# Patient Record
Sex: Male | Born: 1995 | Race: Black or African American | Hispanic: No | Marital: Single | Smoking: Never smoker
Health system: Southern US, Community
[De-identification: ages and names within clinical notes are randomized; demographics above are authoritative.]

---
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
## 2015-11-25 ENCOUNTER — Ambulatory Visit (HOSPITAL_COMMUNITY)
Admission: EM | Admit: 2015-11-25 | Discharge: 2015-11-25 | Disposition: A | Discharge status: Nursing Home (Includes ICF) | Payer: Managed Care, Other (non HMO)

## 2015-11-25 ENCOUNTER — Emergency Department (HOSPITAL_COMMUNITY)
Admission: EM | Admit: 2015-11-25 | Discharge: 2015-11-25 | Disposition: A | Discharge status: Home / Self Care | Payer: Managed Care, Other (non HMO) | Attending: Emergency Medicine | Admitting: Emergency Medicine

## 2015-11-25 ENCOUNTER — Encounter (HOSPITAL_COMMUNITY): Payer: Self-pay | Admitting: Emergency Medicine

## 2015-11-25 ENCOUNTER — Emergency Department (HOSPITAL_COMMUNITY): Payer: Managed Care, Other (non HMO)

## 2015-11-25 DIAGNOSIS — Y9367 Activity, basketball: Secondary | ICD-10-CM | POA: Diagnosis not present

## 2015-11-25 DIAGNOSIS — Y999 Unspecified external cause status: Secondary | ICD-10-CM | POA: Insufficient documentation

## 2015-11-25 DIAGNOSIS — Y929 Unspecified place or not applicable: Secondary | ICD-10-CM | POA: Diagnosis not present

## 2015-11-25 DIAGNOSIS — W500XXA Accidental hit or strike by another person, initial encounter: Secondary | ICD-10-CM | POA: Diagnosis not present

## 2015-11-25 DIAGNOSIS — S060X0A Concussion without loss of consciousness, initial encounter: Secondary | ICD-10-CM | POA: Insufficient documentation

## 2015-11-25 DIAGNOSIS — R42 Dizziness and giddiness: Secondary | ICD-10-CM

## 2015-11-25 DIAGNOSIS — R5383 Other fatigue: Secondary | ICD-10-CM | POA: Diagnosis not present

## 2015-11-25 DIAGNOSIS — S0990XA Unspecified injury of head, initial encounter: Secondary | ICD-10-CM

## 2015-11-25 DIAGNOSIS — Z79899 Other long term (current) drug therapy: Secondary | ICD-10-CM | POA: Diagnosis not present

## 2015-11-25 MED ORDER — IBUPROFEN 800 MG PO TABS
800.0000 mg | ORAL_TABLET | Freq: Once | ORAL | Status: AC
  Administered 2015-11-25: 800 mg via ORAL
  Filled 2015-11-25: qty 1

## 2015-11-25 MED ORDER — ACETAMINOPHEN 500 MG PO TABS
1000.0000 mg | ORAL_TABLET | Freq: Once | ORAL | Status: AC
  Administered 2015-11-25: 1000 mg via ORAL
  Filled 2015-11-25: qty 2
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
## 2015-11-25 NOTE — ED Provider Notes (Signed)
CSN: [id number]     Arrival date & time 11/25/15  1403 History   None    Chief Complaint  Patient presents with  . Facial Pain  . Dizziness   (Consider location/radiation/quality/duration/timing/severity/associated sxs/prior Treatment) HPI Comments: Patient c/o head injury due to being struck in mouth/face during basketball game about an hour ago. He denies LOC.  He states he has been getting progressively dizzy and feeling weak and sleepy.  Patient is a 20 y.o. male presenting with dizziness and head injury. The history is provided by the patient.  Dizziness Quality:  Head spinning and lightheadedness Severity:  Moderate Onset quality:  Sudden Duration:  1 hour Timing:  Constant Progression:  Worsening Chronicity:  New Context: not with loss of consciousness   Relieved by:  Nothing Associated symptoms: headaches   Head Injury Location:  Generalized Time since incident:  1 hour Mechanism of injury: direct blow   Pain details:    Quality:  Dull   Radiates to:  Face   Severity:  Severe   Duration:  1 hour   Timing:  Constant   Progression:  Worsening Chronicity:  New Relieved by:  Nothing Worsened by:  Nothing tried Associated symptoms: disorientation and headache     History reviewed. No pertinent past medical history. History reviewed. No pertinent past surgical history. History reviewed. No pertinent family history. Social History  Substance Use Topics  . Smoking status: Never Smoker   . Smokeless tobacco: Never Used  . Alcohol Use: No    Review of Systems  Constitutional: Negative.   Eyes: Negative.   Respiratory: Negative.   Cardiovascular: Negative.   Genitourinary: Negative.   Musculoskeletal: Negative.   Skin: Negative.   Allergic/Immunologic: Negative.   Neurological: Positive for dizziness and headaches.  Hematological: Negative.   Psychiatric/Behavioral: Negative.     Allergies  Review of patient's allergies indicates no known  allergies.  Home Medications   Prior to Admission medications   Medication Sig Start Date End Date Taking? Authorizing Provider  naproxen (NAPROSYN) 500 MG tablet Take 1 tablet (500 mg total) by mouth 2 (two) times daily with a meal. 10/10/12   Andrena MewsMichael D Rigby, DO   Meds Ordered and Administered this Visit  Medications - No data to display  BP 127/63 mmHg  Pulse 66  Temp(Src) 97.1 F (36.2 C) (Oral)  Resp 16  SpO2 100% No data found.   Physical Exam  Constitutional: He is oriented to person, place, and time. He appears well-developed.  HENT:  Head: Normocephalic.  Right Ear: External ear normal.  Left Ear: External ear normal.  Mouth/Throat: Oropharynx is clear and moist.  Upper lip with erythematous abrasion.  Eyes: Conjunctivae are normal. Pupils are equal, round, and reactive to light.  Neck: Normal range of motion. Neck supple.  Cardiovascular: Normal rate, regular rhythm and normal heart sounds.   Pulmonary/Chest: Effort normal and breath sounds normal.  Neurological: He is alert and oriented to person, place, and time.  Patient is lethargic and sleepy.  He arouses to verbal. His speech is clear.      ED Course  Procedures (including critical care time)  Labs Review Labs Reviewed - No data to display  Imaging Review No results found.   Visual Acuity Review  Right Eye Distance:   Left Eye Distance:   Bilateral Distance:    Right Eye Near:   Left Eye Near:    Bilateral Near:         MDM  Head injury  Lethargy Dizziness Concussion  Transfer to ED to r/o severe concussion or intracranial injury. Transfer to ED to r/o severe concussion  Deatra CanterWilliam J Oxford, FNP 11/25/15 1516

## 2015-11-25 NOTE — Discharge Instructions (Signed)
Concussion, Adult  A concussion, or closed-head injury, is a brain injury caused by a direct blow to the head or by a quick and sudden movement (jolt) of the head or neck. Concussions are usually not life-threatening. Even so, the effects of a concussion can be serious. If you have had a concussion before, you are more likely to experience concussion-like symptoms after a direct blow to the head.   CAUSES  · Direct blow to the head, such as from running into another player during a soccer game, being hit in a fight, or hitting your head on a hard surface.  · A jolt of the head or neck that causes the brain to move back and forth inside the skull, such as in a car crash.  SIGNS AND SYMPTOMS  The signs of a concussion can be hard to notice. Early on, they may be missed by you, family members, and health care providers. You may look fine but act or feel differently.  Symptoms are usually temporary, but they may last for days, weeks, or even longer. Some symptoms may appear right away while others may not show up for hours or days. Every head injury is different. Symptoms include:  · Mild to moderate headaches that will not go away.  · A feeling of pressure inside your head.  · Having more trouble than usual:    Learning or remembering things you have heard.    Answering questions.    Paying attention or concentrating.    Organizing daily tasks.    Making decisions and solving problems.  · Slowness in thinking, acting or reacting, speaking, or reading.  · Getting lost or being easily confused.  · Feeling tired all the time or lacking energy (fatigued).  · Feeling drowsy.  · Sleep disturbances.    Sleeping more than usual.    Sleeping less than usual.    Trouble falling asleep.    Trouble sleeping (insomnia).  · Loss of balance or feeling lightheaded or dizzy.  · Nausea or vomiting.  · Numbness or tingling.  · Increased sensitivity to:    Sounds.    Lights.    Distractions.  · Vision problems or eyes that tire  easily.  · Diminished sense of taste or smell.  · Ringing in the ears.  · Mood changes such as feeling sad or anxious.  · Becoming easily irritated or angry for little or no reason.  · Lack of motivation.  · Seeing or hearing things other people do not see or hear (hallucinations).  DIAGNOSIS  Your health care provider can usually diagnose a concussion based on a description of your injury and symptoms. He or she will ask whether you passed out (lost consciousness) and whether you are having trouble remembering events that happened right before and during your injury.  Your evaluation might include:  · A brain scan to look for signs of injury to the brain. Even if the test shows no injury, you may still have a concussion.  · Blood tests to be sure other problems are not present.  TREATMENT  · Concussions are usually treated in an emergency department, in urgent care, or at a clinic. You may need to stay in the hospital overnight for further treatment.  · Tell your health care provider if you are taking any medicines, including prescription medicines, over-the-counter medicines, and natural remedies. Some medicines, such as blood thinners (anticoagulants) and aspirin, may increase the chance of complications. Also tell your health care   provider whether you have had alcohol or are taking illegal drugs. This information may affect treatment.  · Your health care provider will send you home with important instructions to follow.  · How fast you will recover from a concussion depends on many factors. These factors include how severe your concussion is, what part of your brain was injured, your age, and how healthy you were before the concussion.  · Most people with mild injuries recover fully. Recovery can take time. In general, recovery is slower in older persons. Also, persons who have had a concussion in the past or have other medical problems may find that it takes longer to recover from their current injury.  HOME  CARE INSTRUCTIONS  General Instructions  · Carefully follow the directions your health care provider gave you.  · Only take over-the-counter or prescription medicines for pain, discomfort, or fever as directed by your health care provider.  · Take only those medicines that your health care provider has approved.  · Do not drink alcohol until your health care provider says you are well enough to do so. Alcohol and certain other drugs may slow your recovery and can put you at risk of further injury.  · If it is harder than usual to remember things, write them down.  · If you are easily distracted, try to do one thing at a time. For example, do not try to watch TV while fixing dinner.  · Talk with family members or close friends when making important decisions.  · Keep all follow-up appointments. Repeated evaluation of your symptoms is recommended for your recovery.  · Watch your symptoms and tell others to do the same. Complications sometimes occur after a concussion. Older adults with a brain injury may have a higher risk of serious complications, such as a blood clot on the brain.  · Tell your teachers, school nurse, school counselor, coach, athletic trainer, or work manager about your injury, symptoms, and restrictions. Tell them about what you can or cannot do. They should watch for:    Increased problems with attention or concentration.    Increased difficulty remembering or learning new information.    Increased time needed to complete tasks or assignments.    Increased irritability or decreased ability to cope with stress.    Increased symptoms.  · Rest. Rest helps the brain to heal. Make sure you:    Get plenty of sleep at night. Avoid staying up late at night.    Keep the same bedtime hours on weekends and weekdays.    Rest during the day. Take daytime naps or rest breaks when you feel tired.  · Limit activities that require a lot of thought or concentration. These include:    Doing homework or job-related  work.    Watching TV.    Working on the computer.  · Avoid any situation where there is potential for another head injury (football, hockey, soccer, basketball, martial arts, downhill snow sports and horseback riding). Your condition will get worse every time you experience a concussion. You should avoid these activities until you are evaluated by the appropriate follow-up health care providers.  Returning To Your Regular Activities  You will need to return to your normal activities slowly, not all at once. You must give your body and brain enough time for recovery.  · Do not return to sports or other athletic activities until your health care provider tells you it is safe to do so.  · Ask   your health care provider when you can drive, ride a bicycle, or operate heavy machinery. Your ability to react may be slower after a brain injury. Never do these activities if you are dizzy.  · Ask your health care provider about when you can return to work or school.  Preventing Another Concussion  It is very important to avoid another brain injury, especially before you have recovered. In rare cases, another injury can lead to permanent brain damage, brain swelling, or death. The risk of this is greatest during the first 7-10 days after a head injury. Avoid injuries by:  · Wearing a seat belt when riding in a car.  · Drinking alcohol only in moderation.  · Wearing a helmet when biking, skiing, skateboarding, skating, or doing similar activities.  · Avoiding activities that could lead to a second concussion, such as contact or recreational sports, until your health care provider says it is okay.  · Taking safety measures in your home.    Remove clutter and tripping hazards from floors and stairways.    Use grab bars in bathrooms and handrails by stairs.    Place non-slip mats on floors and in bathtubs.    Improve lighting in dim areas.  SEEK MEDICAL CARE IF:  · You have increased problems paying attention or  concentrating.  · You have increased difficulty remembering or learning new information.  · You need more time to complete tasks or assignments than before.  · You have increased irritability or decreased ability to cope with stress.  · You have more symptoms than before.  Seek medical care if you have any of the following symptoms for more than 2 weeks after your injury:  · Lasting (chronic) headaches.  · Dizziness or balance problems.  · Nausea.  · Vision problems.  · Increased sensitivity to noise or light.  · Depression or mood swings.  · Anxiety or irritability.  · Memory problems.  · Difficulty concentrating or paying attention.  · Sleep problems.  · Feeling tired all the time.  SEEK IMMEDIATE MEDICAL CARE IF:  · You have severe or worsening headaches. These may be a sign of a blood clot in the brain.  · You have weakness (even if only in one hand, leg, or part of the face).  · You have numbness.  · You have decreased coordination.  · You vomit repeatedly.  · You have increased sleepiness.  · One pupil is larger than the other.  · You have convulsions.  · You have slurred speech.  · You have increased confusion. This may be a sign of a blood clot in the brain.  · You have increased restlessness, agitation, or irritability.  · You are unable to recognize people or places.  · You have neck pain.  · It is difficult to wake you up.  · You have unusual behavior changes.  · You lose consciousness.  MAKE SURE YOU:  · Understand these instructions.  · Will watch your condition.  · Will get help right away if you are not doing well or get worse.     This information is not intended to replace advice given to you by your health care provider. Make sure you discuss any questions you have with your health care provider.     Document Released: 08/06/2003 Document Revised: 06/06/2014 Document Reviewed: 12/06/2012  Elsevier Interactive Patient Education ©2016 [employer]

## 2015-11-25 NOTE — ED Notes (Signed)
[facility] a [facility] of face pain and dizziness secondary to getting hit in the face with a shoulder while playing basketball. The patient denied any LOC.

## 2015-11-25 NOTE — ED Notes (Signed)
Pt states he was playing basketball earlier today and was hit in the face with a shoulder and busted his upper lip. Pt states he is having dizziness and "feels weak" pt states he feels "hot". Pt denies any LOC. Pt sent from [facility] for further evaluation. pupils are equal round and reactive. Pt is alert and ox4. Denies any n/v.

## 2016-12-23 ENCOUNTER — Encounter (HOSPITAL_COMMUNITY): Payer: Self-pay | Admitting: *Deleted

## 2016-12-23 ENCOUNTER — Ambulatory Visit (HOSPITAL_COMMUNITY)
Admission: EM | Admit: 2016-12-23 | Discharge: 2016-12-23 | Disposition: A | Discharge status: Home / Self Care | Payer: BLUE CROSS/BLUE SHIELD | Attending: Internal Medicine | Admitting: Internal Medicine

## 2016-12-23 DIAGNOSIS — Z79899 Other long term (current) drug therapy: Secondary | ICD-10-CM | POA: Insufficient documentation

## 2016-12-23 DIAGNOSIS — Z113 Encounter for screening for infections with a predominantly sexual mode of transmission: Secondary | ICD-10-CM | POA: Insufficient documentation

## 2016-12-23 DIAGNOSIS — R369 Urethral discharge, unspecified: Secondary | ICD-10-CM | POA: Insufficient documentation

## 2016-12-23 MED ORDER — CEFTRIAXONE SODIUM 250 MG IJ SOLR
250.0000 mg | Freq: Once | INTRAMUSCULAR | Status: AC
  Administered 2016-12-23: 250 mg via INTRAMUSCULAR

## 2016-12-23 MED ORDER — AZITHROMYCIN 250 MG PO TABS
1000.0000 mg | ORAL_TABLET | Freq: Once | ORAL | Status: AC
  Administered 2016-12-23: 1000 mg via ORAL

## 2016-12-23 MED ORDER — AZITHROMYCIN 250 MG PO TABS
ORAL_TABLET | ORAL | Status: AC
  Filled 2016-12-23: qty 4

## 2016-12-23 MED ORDER — CEFTRIAXONE SODIUM 250 MG IJ SOLR
INTRAMUSCULAR | Status: AC
  Filled 2016-12-23: qty 250

## 2016-12-23 NOTE — ED Provider Notes (Signed)
CSN: [id number]     Arrival date & time 12/23/16  1515 History   None    Chief Complaint  Patient presents with  . Penile Discharge   (Consider location/radiation/quality/duration/timing/severity/associated sxs/prior Treatment) 21 year old male comes in with four-day history of pain at discharge. He denies any other symptoms such as fever, chills, night sweats. Denies abdominal pain, nausea, vomiting. Denies penile pain, redness, itchiness, swelling. Denies urinary symptoms  such as frequency, dysuria, hematuria. he is sexually active with one partner, did have condom use, but he states "condom broke". He does not know if partner has any symptoms. Would like to be treated empirically for STDs.      History reviewed. No pertinent past medical history. History reviewed. No pertinent surgical history. No family history on file. Social History  Substance Use Topics  . Smoking status: Never Smoker  . Smokeless tobacco: Never Used  . Alcohol use No    Review of Systems  Reason unable to perform ROS: See HPI as above.    Allergies  Patient has no known allergies.  Home Medications   Prior to Admission medications   Medication Sig Start Date End Date Taking? Authorizing Provider  naproxen (NAPROSYN) 500 MG tablet Take 1 tablet (500 mg total) by mouth 2 (two) times daily with a meal. 10/10/12   Andrena Mewsigby, Michael D, DO   Meds Ordered and Administered this Visit   Medications  azithromycin [facility](ZITHROMAX) tablet 1,000 mg (1,000 mg Oral Given 12/23/16 1608)  cefTRIAXone (ROCEPHIN) injection 250 mg (250 mg Intramuscular Given 12/23/16 1608)    BP 122/74 (BP Location: Right Arm)   Pulse 78   Temp 98.6 F (37 C) (Oral)   Resp 18   SpO2 100%  No data found.   Physical Exam  Constitutional: He is oriented to person, place, and time. He appears well-developed and well-nourished. No distress.  HENT:  Head: Normocephalic and atraumatic.  Cardiovascular: Normal rate, regular rhythm and  normal heart sounds.  Exam reveals no gallop and no friction rub.   No murmur heard. Pulmonary/Chest: Effort normal and breath sounds normal. He has no wheezes. He has no rales.  Abdominal: Soft. Bowel sounds are normal. He exhibits no mass. There is no tenderness. There is no rebound and no guarding.  Genitourinary:  Genitourinary Comments: Deferred due to patient's preference.  Neurological: He is alert and oriented to person, place, and time.  Skin: Skin is warm and dry.  Psychiatric: He has a normal mood and affect. His behavior is normal. Judgment normal.    Urgent Care Course     Procedures (including critical care time)  Labs Review Labs Reviewed  URINE CYTOLOGY ANCILLARY ONLY    Imaging Review No results found.     MDM   1. Penile discharge    Patient would like to be treated empirically for gonorrhea and Chlamydia. Azithromycin and Rocephin in office today. Patient to refrain from sexual activity for the next 7 days. Urine cytology sent, patient will be contacted with any positive results that requires additional treatment. Monitor for worsening symptoms such as fever, abdominal pain, nausea, vomiting, penile pain/swelling, to follow-up for reevaluation.    Belinda FisherYu, Sharilynn Cassity V, PA-C 12/23/16 1622

## 2016-12-23 NOTE — ED Triage Notes (Signed)
Pt  Reports   Penile   Discharge   4  Days         Denies   Any    Other   Symptoms

## 2016-12-23 NOTE — Discharge Instructions (Signed)
You were treated empirically for gonorrhea and Chlamydia. Urine testing sent, you'll be contacted with any positive results that require further treatment. Refrain from sexual activity for the next 7 days. Monitor for worsening of symptoms such as fever, abdominal pain, nausea, vomiting, penile pain/swelling, to follow-up for reevaluation.

## 2016-12-26 LAB — URINE CYTOLOGY ANCILLARY ONLY
CHLAMYDIA, DNA PROBE: POSITIVE — AB
NEISSERIA GONORRHEA: NEGATIVE
Trichomonas: NEGATIVE

## 2017-03-01 IMAGING — CT CT HEAD W/O CM
4 series · 18 of 47 positions shown, 20 images · non-contrast
Comparison: None

CLINICAL DATA: Struck in face by another person's head while
playing basketball, severe frontal headache and dizziness since

EXAM:
CT HEAD WITHOUT CONTRAST
TECHNIQUE: Contiguous axial images were obtained from the base of the skull
through the vertex without intravenous contrast.

[Series 201: head w/o, idose (1) · axial · non-contrast · 0.49mm/px · z∈[+96,+216]mm · 8 of 32 slices shown, 10 images]
[im 4/32  brain]
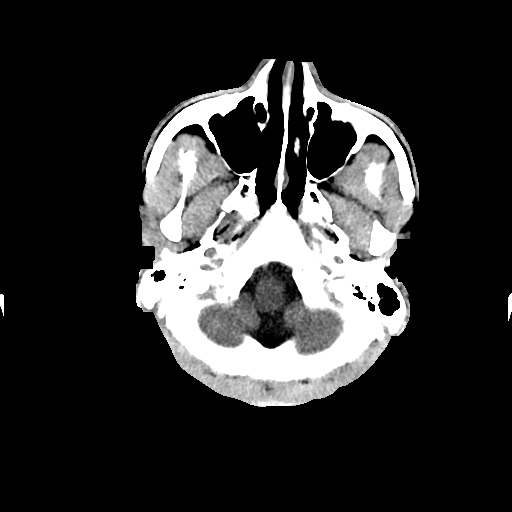
[im 4/32  bone]
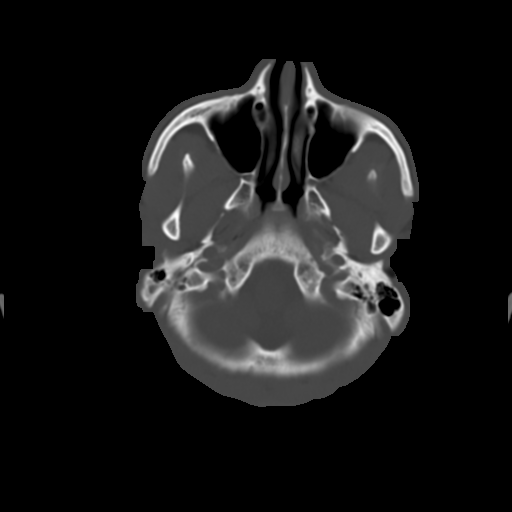
[im 7/32  brain]
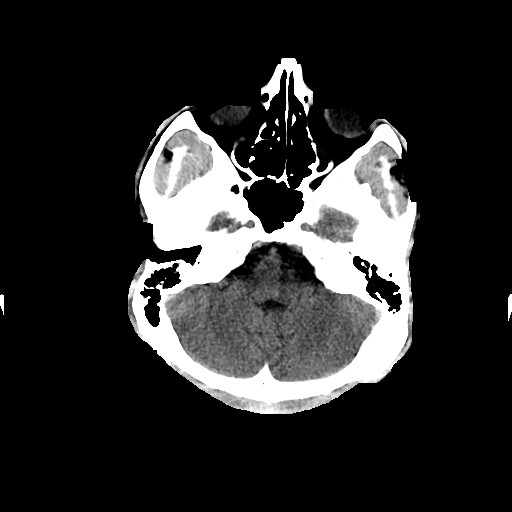
[im 11/32  brain]
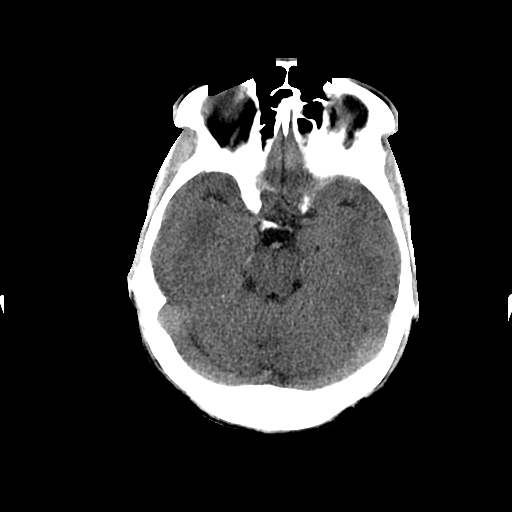
[im 14/32  brain]
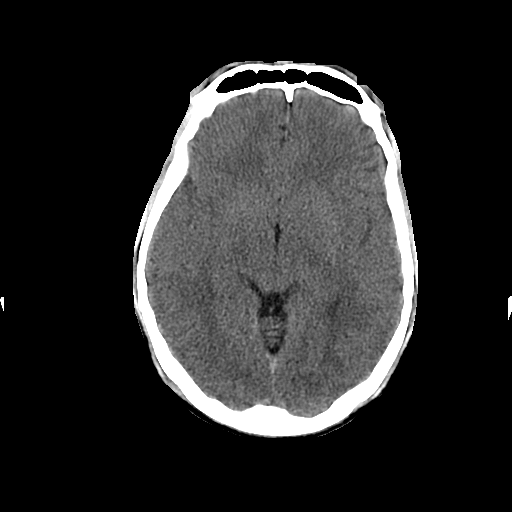
[im 18/32  brain]
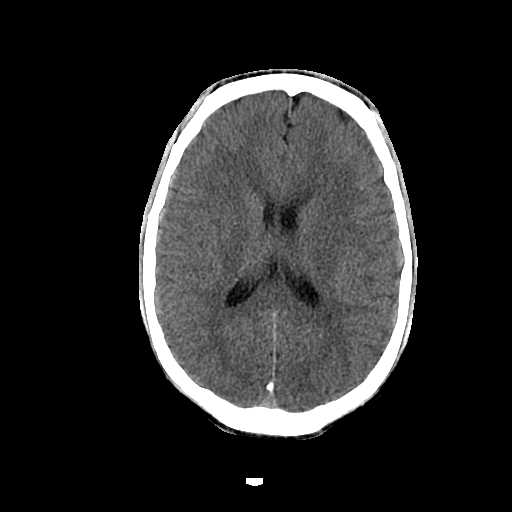
[im 18/32  bone]
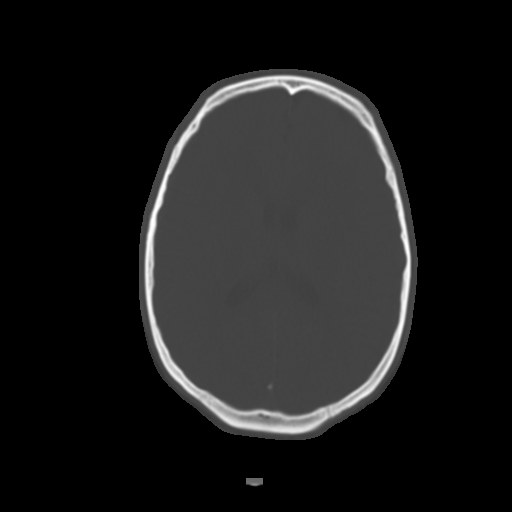
[im 21/32  brain]
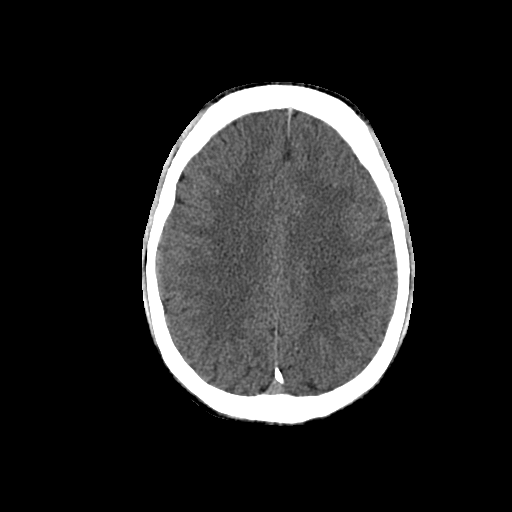
[im 25/32  brain]
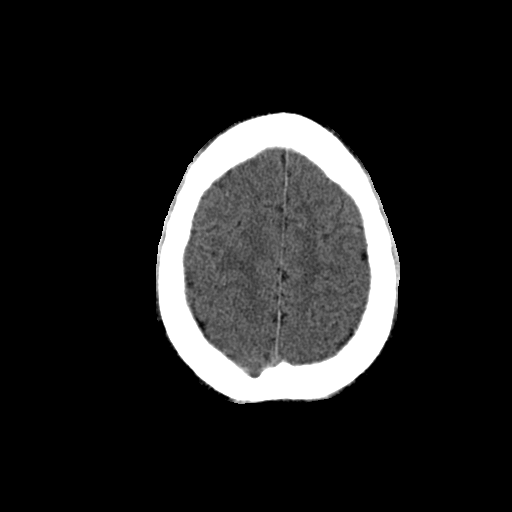
[im 28/32  brain]
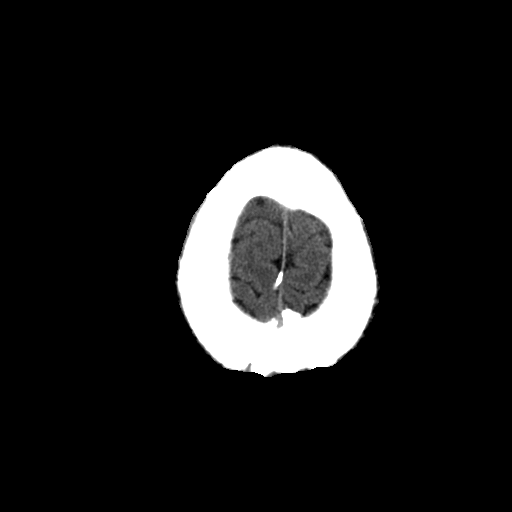

[Series 202: head w/o bone, idose (1) · axial · non-contrast · 0.49mm/px · z∈[+95,+145]mm · 4 of 64 slices shown]
[im 7/64  bone]
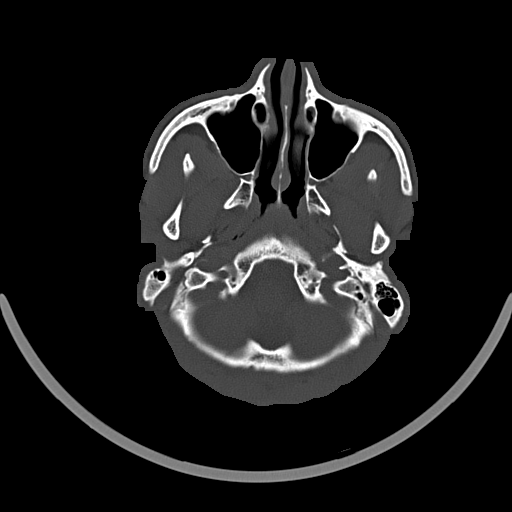
[im 14/64  bone]
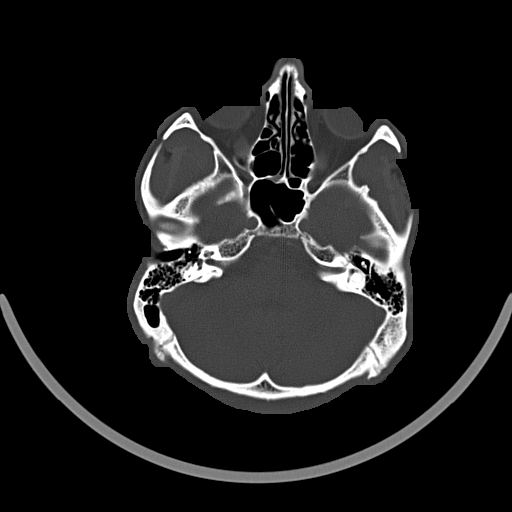
[im 20/64  bone]
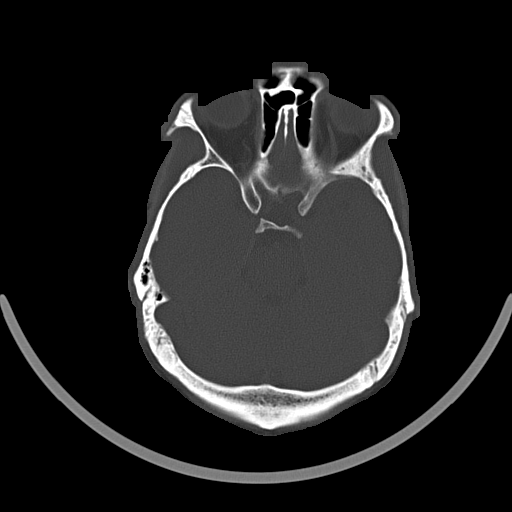
[im 27/64  bone]
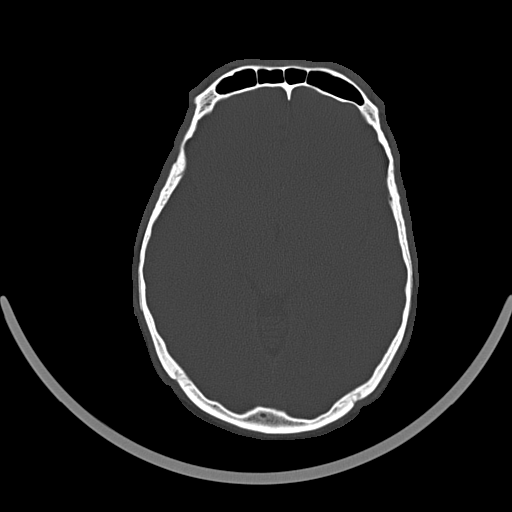

[Series 203: coronal st, idose (1) · coronal · 0.40mm/px · 3 of 78 slices shown]
[im 26/78  brain]
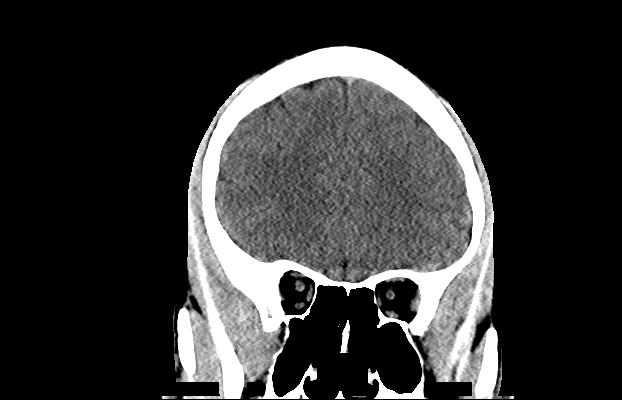
[im 35/78  brain]
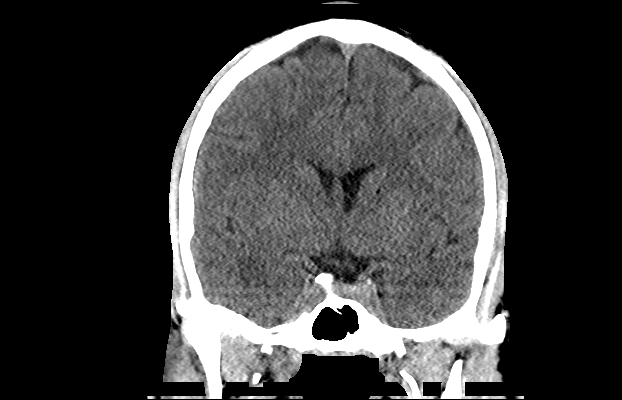
[im 43/78  brain]
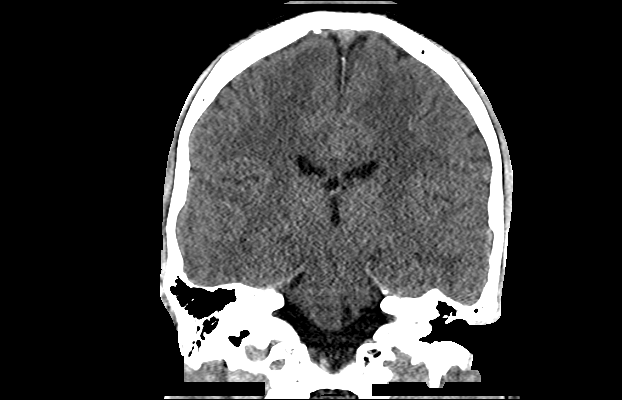

[Series 204: sagittal st, idose (1) · sagittal · 0.40mm/px · 3 of 83 slices shown]
[im 28/83  brain]
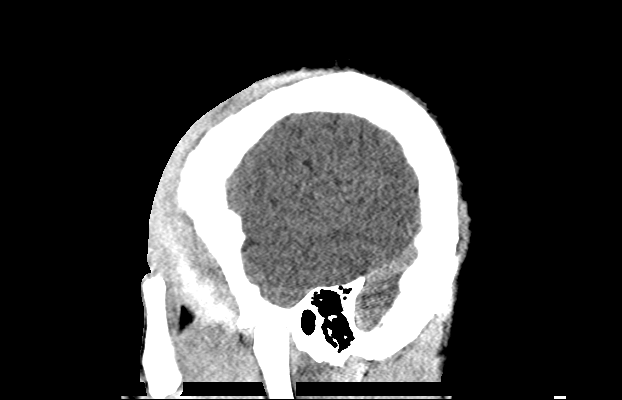
[im 42/83  brain]
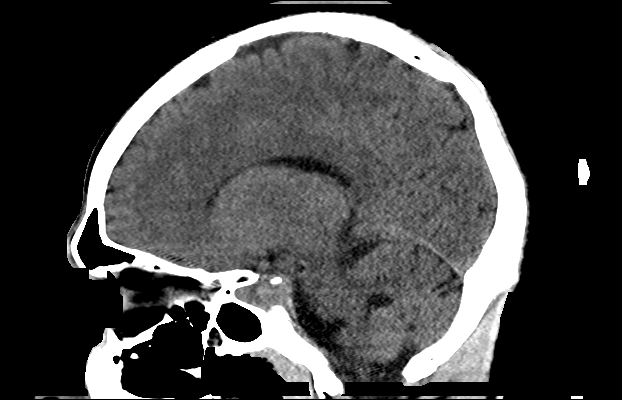
[im 55/83  brain]
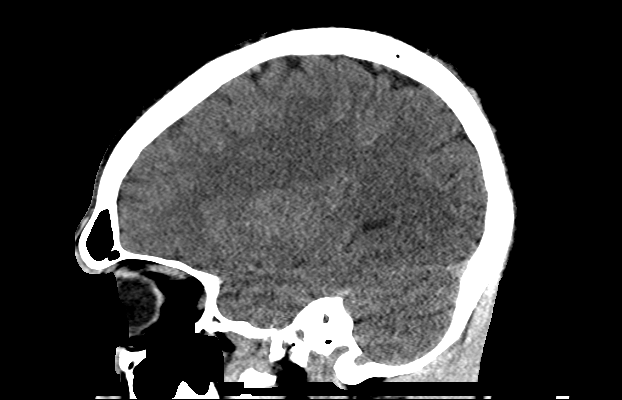

[18 of 47 positions shown; findings below may reference images not displayed]

FINDINGS: Normal ventricular morphology.

No midline shift or mass effect.

Normal appearance of brain parenchyma.

No intracranial hemorrhage, mass lesion, or evidence acute
infarction.

No extra-axial fluid collections.

Orbits intact with clear soft tissue planes.

Visualized paranasal sinuses and mastoid air cells clear.

No fractures identified.
IMPRESSION: Normal exam.

## 2021-12-15 ENCOUNTER — Encounter (HOSPITAL_COMMUNITY): Payer: Self-pay | Admitting: Emergency Medicine

## 2021-12-15 ENCOUNTER — Emergency Department (HOSPITAL_COMMUNITY): Payer: BC Managed Care – PPO

## 2021-12-15 ENCOUNTER — Emergency Department (HOSPITAL_COMMUNITY)
Admission: EM | Admit: 2021-12-15 | Discharge: 2021-12-15 | Disposition: A | Discharge status: Home / Self Care | Payer: BC Managed Care – PPO | Attending: Emergency Medicine | Admitting: Emergency Medicine

## 2021-12-15 DIAGNOSIS — S62313A Displaced fracture of base of third metacarpal bone, left hand, initial encounter for closed fracture: Secondary | ICD-10-CM | POA: Diagnosis not present

## 2021-12-15 DIAGNOSIS — S6992XA Unspecified injury of left wrist, hand and finger(s), initial encounter: Secondary | ICD-10-CM | POA: Diagnosis present

## 2021-12-15 DIAGNOSIS — W228XXA Striking against or struck by other objects, initial encounter: Secondary | ICD-10-CM | POA: Diagnosis not present

## 2021-12-15 DIAGNOSIS — M25532 Pain in left wrist: Secondary | ICD-10-CM | POA: Diagnosis not present

## 2021-12-15 NOTE — ED Provider Triage Note (Signed)
Emergency Medicine Provider Triage Evaluation Note  [name] , a 26 y.o. male  was evaluated in triage.  Pt complains of left wrist pain.  Patient states that he hit a wall with a closed fist approximately a week and a half ago.  He states that since the incident, he has had persistent left-sided wrist pain as well as limited wrist extension on affected side.  Denies numbness or weakness and associated hand.  Review of Systems  Positive: See above Negative:   Physical Exam  BP 131/72 (BP Location: Right Arm)   Pulse 61   Temp 98.4 F (36.9 C) (Oral)   Resp 16   SpO2 99%  Gen:   Awake, no distress   Resp:  Normal effort  MSK:   Moves extremities without difficulty  Other:  Patient has full range of motion of affected hand.  Patient able to make okay sign oppose thumb fist, thumbs up, resist horizontal adduction of fingers.  Patient has decreased range of motion of left wrist extension secondary to pain.  No tenderness to palpation along metacarpals or carpal bones of affected hand.  No tenderness along distal radius or ulna.  Radial pulses full and intact bilaterally.  Patient planing of no sensory deficits distal to injury.  No obvious signs of breaks in skin or cellulitic changes.  Medical Decision Making  Medically screening exam initiated at 2:45 PM.  Appropriate orders placed.  KJUAN SEIPP was informed that the remainder of the evaluation will be completed by another provider, this initial triage assessment does not replace that evaluation, and the importance of remaining in the ED until their evaluation is complete.     Peter Garter, [place] 12/15/21 1447

## 2021-12-15 NOTE — ED Triage Notes (Signed)
Patient here with complaint of left hand pain that started approximately one and a half weeks ago after he punched a wall.

## 2021-12-15 NOTE — ED Notes (Signed)
Pt provided discharge instructions and prescription information. Pt was given the opportunity to ask questions and questions were answered.   

## 2021-12-15 NOTE — ED Provider Notes (Signed)
[facility] EMERGENCY DEPARTMENT Provider Note   CSN: [id number] Arrival date & time: 12/15/21  1331     History  Chief Complaint  Patient presents with   Hand Injury    [name] is a 26 y.o. male.   Hand Injury   Patient presents due to left hand and wrist pain.  This happened a week ago when he punched a wall.  He denies making contact with the human mouth.  He has been icing it and using IcyHot which is helped somewhat.  He is having pain which is worse with any flexion or extension of the wrist.  No pain elsewhere, no lacerations or abrasions.  Home Medications Prior to Admission medications   Medication Sig Start Date End Date Taking? Authorizing Provider  naproxen (NAPROSYN) 500 MG tablet Take 1 tablet (500 mg total) by mouth 2 (two) times daily with a meal. 10/10/12   Andrena Mews, DO      Allergies    Patient has no known allergies.    Review of Systems   Review of Systems  Physical Exam Updated Vital Signs BP 131/72 (BP Location: Right Arm)   Pulse 61   Temp 98.4 F (36.9 C) (Oral)   Resp 16   SpO2 99%  Physical Exam Vitals and nursing note reviewed. Exam conducted with a chaperone present.  Constitutional:      General: He is not in acute distress.    Appearance: Normal appearance.  HENT:     Head: Normocephalic and atraumatic.  Eyes:     General: No scleral icterus.    Extraocular Movements: Extraocular movements intact.     Pupils: Pupils are equal, round, and reactive to light.  Cardiovascular:     Pulses: Normal pulses.  Musculoskeletal:        General: Tenderness present.     Comments: Able to make a fist.  Complex extend at the wrist.  No crepitus, tenderness at the base of the third metacarpal  Skin:    Capillary Refill: Capillary refill takes less than 2 seconds.     Coloration: Skin is not jaundiced.  Neurological:     Mental Status: He is alert. Mental status is at baseline.     Coordination:  Coordination normal.     ED Results / Procedures / Treatments   Labs (all labs ordered are listed, but only abnormal results are displayed) Labs Reviewed - No data to display  EKG None  Radiology DG Hand Complete Left  Result Date: 12/15/2021 CLINICAL DATA:  Trauma, pain EXAM: LEFT HAND - COMPLETE 3+ VIEW COMPARISON:  12/15/2021 FINDINGS: There is a subtle acute oblique fracture of the left third metacarpal base at the wrist. This fracture is better demonstrated on the left wrist comparison. No additional acute osseous finding of the hand. Normal alignment. No subluxation or dislocation. IMPRESSION: Acute oblique fracture of the left third metacarpal base. Electronically Signed   By: Judie Petit.  Shick M.D.   On: 12/15/2021 15:08   DG Wrist Complete Left  Result Date: 12/15/2021 CLINICAL DATA:  Trauma, pain EXAM: LEFT WRIST - COMPLETE 3+ VIEW COMPARISON:  12/15/2021 FINDINGS: There is an acute oblique fracture with minimal displacement of the third metacarpal base at the wrist. No other acute osseous finding. Distal radius, ulna and carpal bones appear intact. IMPRESSION: Acute minimally displaced fracture of the third metacarpal base at the wrist Electronically Signed   By: Judie Petit.  Shick M.D.   On: 12/15/2021 15:06  Procedures Procedures    Medications Ordered in ED Medications - No data to display  ED Course/ Medical Decision Making/ A&P                           Medical Decision Making Amount and/or Complexity of Data Reviewed Radiology: ordered.   Patient presents due to hand and wrist pain.  He is neurovascular intact with brisk cap refill, radial pulse 2+.  No crepitus or contusions or lacerations over the hand.  Patient does have a fracture at the third metacarpal base.  We will put in volar's splint and have him follow-up with hand.  Anti-inflammatories advised, discharged stable condition        Final Clinical Impression(s) / ED Diagnoses Final diagnoses:  None    Rx /  DC Orders ED Discharge Orders     None         Theron Arista, PA-C 12/15/21 1726    Wynetta Fines, MD 12/16/21 [phone]

## 2021-12-15 NOTE — Progress Notes (Signed)
Orthopedic Tech Progress Note Patient Details:  [name] 12-14-1995 [id number]  Ortho Devices Type of Ortho Device: Volar splint Ortho Device/Splint Location: LUE Ortho Device/Splint Interventions: Ordered, Application   Post Interventions Patient Tolerated: Well Instructions Provided: Care of device  [name] 12/15/2021, 5:45 PM

## 2021-12-15 NOTE — Discharge Instructions (Signed)
Take ibuprofen and Tylenol for pain.  You can take 800 mg of ibuprofen 3 times daily with food and water.  You have a fracture of the third metacarpal of your hand as discussed.  Wear the volar splint, follow-up with hand next week for reevaluation.  You will need to call to schedule that appointment.
